# Patient Record
Sex: Female | Born: 1970 | Race: White | Hispanic: No | Marital: Married
Health system: Southern US, Community
[De-identification: ages and names within clinical notes are randomized; demographics above are authoritative.]

## PROBLEM LIST (undated history)

## (undated) DIAGNOSIS — Z9889 Other specified postprocedural states: Secondary | ICD-10-CM

## (undated) DIAGNOSIS — I10 Essential (primary) hypertension: Secondary | ICD-10-CM

## (undated) HISTORY — PX: WRIST SURGERY: SHX841

## (undated) HISTORY — PX: KNEE SURGERY: SHX244

## (undated) HISTORY — PX: BACK SURGERY: SHX140

---

## 2017-09-18 ENCOUNTER — Emergency Department (HOSPITAL_COMMUNITY): Payer: BLUE CROSS/BLUE SHIELD

## 2017-09-18 ENCOUNTER — Inpatient Hospital Stay (HOSPITAL_COMMUNITY)
Admission: EM | Admit: 2017-09-18 | Discharge: 2017-09-30 | DRG: 963 | Disposition: E | Payer: BLUE CROSS/BLUE SHIELD | Attending: General Surgery | Admitting: General Surgery

## 2017-09-18 ENCOUNTER — Encounter (HOSPITAL_COMMUNITY): Payer: Self-pay

## 2017-09-18 DIAGNOSIS — R402112 Coma scale, eyes open, never, at arrival to emergency department: Secondary | ICD-10-CM | POA: Diagnosis present

## 2017-09-18 DIAGNOSIS — G935 Compression of brain: Secondary | ICD-10-CM | POA: Diagnosis present

## 2017-09-18 DIAGNOSIS — R402212 Coma scale, best verbal response, none, at arrival to emergency department: Secondary | ICD-10-CM | POA: Diagnosis present

## 2017-09-18 DIAGNOSIS — S2241XA Multiple fractures of ribs, right side, initial encounter for closed fracture: Secondary | ICD-10-CM | POA: Diagnosis present

## 2017-09-18 DIAGNOSIS — G9382 Brain death: Secondary | ICD-10-CM | POA: Diagnosis present

## 2017-09-18 DIAGNOSIS — R402312 Coma scale, best motor response, none, at arrival to emergency department: Secondary | ICD-10-CM | POA: Diagnosis present

## 2017-09-18 DIAGNOSIS — J9601 Acute respiratory failure with hypoxia: Secondary | ICD-10-CM | POA: Diagnosis present

## 2017-09-18 DIAGNOSIS — S2220XA Unspecified fracture of sternum, initial encounter for closed fracture: Secondary | ICD-10-CM | POA: Diagnosis present

## 2017-09-18 DIAGNOSIS — S36039A Unspecified laceration of spleen, initial encounter: Secondary | ICD-10-CM | POA: Diagnosis present

## 2017-09-18 DIAGNOSIS — S20312A Abrasion of left front wall of thorax, initial encounter: Secondary | ICD-10-CM | POA: Diagnosis present

## 2017-09-18 DIAGNOSIS — R000 Tachycardia, unspecified: Secondary | ICD-10-CM | POA: Diagnosis present

## 2017-09-18 DIAGNOSIS — S13111A Dislocation of C0/C1 cervical vertebrae, initial encounter: Secondary | ICD-10-CM | POA: Diagnosis present

## 2017-09-18 DIAGNOSIS — S13121A Dislocation of C1/C2 cervical vertebrae, initial encounter: Secondary | ICD-10-CM | POA: Diagnosis present

## 2017-09-18 DIAGNOSIS — T07XXXA Unspecified multiple injuries, initial encounter: Secondary | ICD-10-CM

## 2017-09-18 DIAGNOSIS — S20311A Abrasion of right front wall of thorax, initial encounter: Secondary | ICD-10-CM | POA: Diagnosis present

## 2017-09-18 DIAGNOSIS — E232 Diabetes insipidus: Secondary | ICD-10-CM | POA: Diagnosis present

## 2017-09-18 HISTORY — DX: Essential (primary) hypertension: I10

## 2017-09-18 HISTORY — DX: Other specified postprocedural states: Z98.890

## 2017-09-18 LAB — CBC
HCT: 38.7 % (ref 36.0–46.0)
HCT: 37.8 % (ref 36.0–46.0)
Hemoglobin: 12.3 g/dL (ref 12.0–15.0)
Hemoglobin: 11.9 g/dL — ABNORMAL LOW (ref 12.0–15.0)
MCH: 29.7 pg (ref 26.0–34.0)
MCH: 29.5 pg (ref 26.0–34.0)
MCHC: 31.8 g/dL (ref 30.0–36.0)
MCHC: 31.5 g/dL (ref 30.0–36.0)
MCV: 93.5 fL (ref 78.0–100.0)
MCV: 93.6 fL (ref 78.0–100.0)
PLATELETS: 290 10*3/uL (ref 150–400)
PLATELETS: 261 10*3/uL (ref 150–400)
RBC: 4.14 MIL/uL (ref 3.87–5.11)
RBC: 4.04 MIL/uL (ref 3.87–5.11)
RDW: 13.7 % (ref 11.5–15.5)
RDW: 14 % (ref 11.5–15.5)
WBC: 13.2 10*3/uL — AB (ref 4.0–10.5)
WBC: 17.7 10*3/uL — ABNORMAL HIGH (ref 4.0–10.5)

## 2017-09-18 LAB — BASIC METABOLIC PANEL
Anion gap: 4 — ABNORMAL LOW (ref 5–15)
BUN: 16 mg/dL (ref 6–20)
CALCIUM: 7.6 mg/dL — AB (ref 8.9–10.3)
CO2: 22 mmol/L (ref 22–32)
CREATININE: 1.01 mg/dL — AB (ref 0.44–1.00)
Chloride: 117 mmol/L — ABNORMAL HIGH (ref 101–111)
Glucose, Bld: 142 mg/dL — ABNORMAL HIGH (ref 65–99)
Potassium: 5.1 mmol/L (ref 3.5–5.1)
SODIUM: 143 mmol/L (ref 135–145)

## 2017-09-18 LAB — ETHANOL

## 2017-09-18 LAB — BLOOD GAS, ARTERIAL
ACID-BASE DEFICIT: 5.5 mmol/L — AB (ref 0.0–2.0)
Acid-base deficit: 5.5 mmol/L — ABNORMAL HIGH (ref 0.0–2.0)
BICARBONATE: 21.9 mmol/L (ref 20.0–28.0)
Bicarbonate: 20.9 mmol/L (ref 20.0–28.0)
DRAWN BY: 24910
Drawn by: 36496
FIO2: 100
MECHVT: 520 mL
O2 CONTENT: 8 L/min
O2 SAT: 91.8 %
O2 SAT: 95.6 %
PATIENT TEMPERATURE: 96
PCO2 ART: 48.4 mmHg — AB (ref 32.0–48.0)
PEEP: 5 cmH2O
PH ART: 7.248 — AB (ref 7.350–7.450)
PO2 ART: 68.9 mmHg — AB (ref 83.0–108.0)
PO2 ART: 83.8 mmHg (ref 83.0–108.0)
Patient temperature: 96
RATE: 22 resp/min
pCO2 arterial: 58.7 mmHg — ABNORMAL HIGH (ref 32.0–48.0)
pH, Arterial: 7.186 — CL (ref 7.350–7.450)

## 2017-09-18 LAB — I-STAT CHEM 8, ED
BUN: 22 mg/dL — AB (ref 6–20)
CALCIUM ION: 1.08 mmol/L — AB (ref 1.15–1.40)
Chloride: 109 mmol/L (ref 101–111)
Creatinine, Ser: 0.9 mg/dL (ref 0.44–1.00)
Glucose, Bld: 157 mg/dL — ABNORMAL HIGH (ref 65–99)
HCT: 38 % (ref 36.0–46.0)
Hemoglobin: 12.9 g/dL (ref 12.0–15.0)
Potassium: 4.7 mmol/L (ref 3.5–5.1)
SODIUM: 140 mmol/L (ref 135–145)
TCO2: 22 mmol/L (ref 22–32)

## 2017-09-18 LAB — COMPREHENSIVE METABOLIC PANEL
ALT: 68 U/L — AB (ref 14–54)
AST: 103 U/L — ABNORMAL HIGH (ref 15–41)
Albumin: 3.3 g/dL — ABNORMAL LOW (ref 3.5–5.0)
Alkaline Phosphatase: 81 U/L (ref 38–126)
Anion gap: 6 (ref 5–15)
BUN: 18 mg/dL (ref 6–20)
CALCIUM: 8 mg/dL — AB (ref 8.9–10.3)
CHLORIDE: 110 mmol/L (ref 101–111)
CO2: 21 mmol/L — ABNORMAL LOW (ref 22–32)
CREATININE: 0.99 mg/dL (ref 0.44–1.00)
Glucose, Bld: 158 mg/dL — ABNORMAL HIGH (ref 65–99)
Potassium: 4.8 mmol/L (ref 3.5–5.1)
Sodium: 137 mmol/L (ref 135–145)
Total Bilirubin: 0.3 mg/dL (ref 0.3–1.2)
Total Protein: 6.3 g/dL — ABNORMAL LOW (ref 6.5–8.1)

## 2017-09-18 LAB — URINALYSIS, ROUTINE W REFLEX MICROSCOPIC
BILIRUBIN URINE: NEGATIVE
GLUCOSE, UA: 50 mg/dL — AB
KETONES UR: NEGATIVE mg/dL
LEUKOCYTES UA: NEGATIVE
NITRITE: NEGATIVE
PH: 8 (ref 5.0–8.0)
Protein, ur: 100 mg/dL — AB
Specific Gravity, Urine: 1.009 (ref 1.005–1.030)
Squamous Epithelial / HPF: NONE SEEN

## 2017-09-18 LAB — LACTIC ACID, PLASMA: LACTIC ACID, VENOUS: 1.7 mmol/L (ref 0.5–1.9)

## 2017-09-18 LAB — I-STAT CG4 LACTIC ACID, ED: Lactic Acid, Venous: 2.16 mmol/L (ref 0.5–1.9)

## 2017-09-18 LAB — PROTIME-INR
INR: 1.16
INR: 1.08
PROTHROMBIN TIME: 14.7 s (ref 11.4–15.2)
PROTHROMBIN TIME: 13.9 s (ref 11.4–15.2)

## 2017-09-18 LAB — ABO/RH: ABO/RH(D): O POS

## 2017-09-18 MED ORDER — SODIUM CHLORIDE 0.9 % IV SOLN
0.0000 ug/min | INTRAVENOUS | Status: DC
  Administered 2017-09-18: 200 ug/min via INTRAVENOUS
  Administered 2017-09-18: 20 ug/min via INTRAVENOUS
  Administered 2017-09-18 (×2): 180 ug/min via INTRAVENOUS
  Filled 2017-09-18 (×5): qty 1

## 2017-09-18 MED ORDER — SODIUM CHLORIDE 0.9 % IV SOLN
INTRAVENOUS | Status: DC
  Administered 2017-09-18 – 2017-09-19 (×3): via INTRAVENOUS

## 2017-09-18 MED ORDER — PANTOPRAZOLE SODIUM 40 MG PO TBEC: 40.0000 mg | DELAYED_RELEASE_TABLET | Freq: Every day | ORAL | Status: DC

## 2017-09-18 MED ORDER — IOPAMIDOL (ISOVUE-300) INJECTION 61%
INTRAVENOUS | Status: AC
  Filled 2017-09-18: qty 100

## 2017-09-18 MED ORDER — SODIUM CHLORIDE 0.9% FLUSH: 10.0000 mL | INTRAVENOUS | Status: DC | PRN

## 2017-09-18 MED ORDER — PANTOPRAZOLE SODIUM 40 MG IV SOLR
40.0000 mg | Freq: Every day | INTRAVENOUS | Status: DC
  Administered 2017-09-18 – 2017-09-19 (×2): 40 mg via INTRAVENOUS
  Filled 2017-09-18 (×2): qty 40

## 2017-09-18 MED ORDER — SODIUM CHLORIDE 0.9 % IV SOLN
0.0000 ug/min | INTRAVENOUS | Status: DC
  Administered 2017-09-18: 225 ug/min via INTRAVENOUS
  Filled 2017-09-18: qty 4

## 2017-09-18 MED ORDER — PHENYLEPHRINE 40 MCG/ML (10ML) SYRINGE FOR IV PUSH (FOR BLOOD PRESSURE SUPPORT)
PREFILLED_SYRINGE | INTRAVENOUS | Status: AC
  Filled 2017-09-18: qty 10

## 2017-09-18 MED ORDER — SODIUM CHLORIDE 0.9 % IV SOLN
INTRAVENOUS | Status: AC | PRN
  Administered 2017-09-18 (×2): 999 mL/h via INTRAVENOUS

## 2017-09-18 MED ORDER — SODIUM CHLORIDE 0.9 % IV SOLN
0.0000 ug/min | INTRAVENOUS | Status: DC
  Administered 2017-09-19 (×2): 225 ug/min via INTRAVENOUS
  Filled 2017-09-18 (×3): qty 8

## 2017-09-18 MED ORDER — PHENYLEPHRINE HCL 10 MG/ML IJ SOLN
INTRAMUSCULAR | Status: AC | PRN
  Administered 2017-09-18: 80 ug

## 2017-09-18 MED ORDER — SODIUM CHLORIDE 0.9% FLUSH
10.0000 mL | Freq: Two times a day (BID) | INTRAVENOUS | Status: DC
  Administered 2017-09-18: 10 mL

## 2017-09-18 MED ORDER — MIDAZOLAM HCL 2 MG/2ML IJ SOLN
INTRAMUSCULAR | Status: AC
  Filled 2017-09-18: qty 6

## 2017-09-18 MED ORDER — FENTANYL CITRATE (PF) 100 MCG/2ML IJ SOLN
INTRAMUSCULAR | Status: AC
  Filled 2017-09-18: qty 2

## 2017-09-18 MED ORDER — CHLORHEXIDINE GLUCONATE CLOTH 2 % EX PADS
6.0000 | MEDICATED_PAD | Freq: Every day | CUTANEOUS | Status: DC
  Administered 2017-09-18 – 2017-09-19 (×2): 6 via TOPICAL
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
## 2017-09-18 NOTE — ED Notes (Signed)
Pt transported to CT 1 
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
## 2017-09-18 NOTE — Progress Notes (Signed)
2017-09-27- Respiratory care note- Apnea test performed with MD at bedside and RN.  Pt placed on 8lpm oxygen as per protocol with ABG drawn prior to initiation of test.  Test initiated as per protocol.  2044 Initial vital signs HR 96, SATS 97%, BP 124/99. 2047 vital signs HR 93, SATS 99%, BP 115/93. 2049 vital signs HR 93, SATS 98%, BP 98/76.  2051 vital signs HR 93, SATS 97%, BP 85/50.  Test completed at 2052 and ABG drawn.  Pt placed back on vent at time of end of test.

## 2017-09-18 NOTE — ED Notes (Signed)
Family at bedside. 

## 2017-09-18 NOTE — Progress Notes (Signed)
CDS called and did not feel there was enough documentation to approach family Apnea test done CO 2 increase only 10 to total of 58 which is not consistent with a positive test  Husband wants 100  % assurance of brain death and this test does not support Clinical exam does support brain death   Jun 25, 2023 need perfusion study in am to give husband reassurance   Continue present care for now

## 2017-09-18 NOTE — Progress Notes (Signed)
RT NOTE:  Apnea test complete. Pre and Post ABG results reported to Dr. Luisa Hart.
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
## 2017-09-18 NOTE — ED Notes (Signed)
MD Wyatt at the bedside.  

## 2017-09-18 NOTE — ED Notes (Signed)
Family at the bedside.

## 2017-09-18 NOTE — ED Notes (Signed)
[facility] is aware of patient's presence.
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
## 2017-09-18 NOTE — ED Notes (Signed)
Phlebotomy at the bedside  

## 2017-09-18 NOTE — Progress Notes (Signed)
Orthopedic Tech Progress Note Patient Details:  Leah [name] 06/05/1971 [id number]  Patient ID: [name], female   DOB: 02-20-1971, 46 y.o.   MRN: [id number]   Nikki Dom 09/11/2017, 3:48 PM Made level 1 trauma visit

## 2017-09-19 ENCOUNTER — Encounter (HOSPITAL_COMMUNITY): Payer: Self-pay

## 2017-09-19 ENCOUNTER — Inpatient Hospital Stay (HOSPITAL_COMMUNITY): Payer: BLUE CROSS/BLUE SHIELD

## 2017-09-19 LAB — MASSIVE TRANSFUSION PROTOCOL ORDER (BLOOD BANK NOTIFICATION)

## 2017-09-19 LAB — PREPARE FRESH FROZEN PLASMA
UNIT DIVISION: 0
UNIT DIVISION: 0
UNIT DIVISION: 0
UNIT DIVISION: 0
UNIT DIVISION: 0
Unit division: 0

## 2017-09-19 LAB — BPAM FFP
BLOOD PRODUCT EXPIRATION DATE: 201809242359
Blood Product Expiration Date: 201809192359
Blood Product Expiration Date: 201809222359
Blood Product Expiration Date: 201809232359
Blood Product Expiration Date: 201809242359
Blood Product Expiration Date: 201810032359
ISSUE DATE / TIME: 201809191524
ISSUE DATE / TIME: 201809191545
ISSUE DATE / TIME: 201809191545
ISSUE DATE / TIME: 201809191524
ISSUE DATE / TIME: 201809191545
ISSUE DATE / TIME: 201809191545
UNIT TYPE AND RH: 6200
UNIT TYPE AND RH: 6200
Unit Type and Rh: 600
Unit Type and Rh: 6200
Unit Type and Rh: 6200
Unit Type and Rh: 8400

## 2017-09-19 LAB — BLOOD GAS, ARTERIAL
Acid-base deficit: 2.1 mmol/L — ABNORMAL HIGH (ref 0.0–2.0)
Bicarbonate: 24.6 mmol/L (ref 20.0–28.0)
DRAWN BY: 33100
FIO2: 100
MECHVT: 520 mL
O2 Saturation: 94.5 %
PEEP: 5 cmH2O
Patient temperature: 98.6
RATE: 22 resp/min
pCO2 arterial: 61.5 mmHg — ABNORMAL HIGH (ref 32.0–48.0)
pH, Arterial: 7.226 — ABNORMAL LOW (ref 7.350–7.450)
pO2, Arterial: 79 mmHg — ABNORMAL LOW (ref 83.0–108.0)

## 2017-09-19 LAB — BASIC METABOLIC PANEL
Anion gap: 7 (ref 5–15)
BUN: 13 mg/dL (ref 6–20)
CO2: 22 mmol/L (ref 22–32)
Calcium: 8 mg/dL — ABNORMAL LOW (ref 8.9–10.3)
Chloride: 123 mmol/L — ABNORMAL HIGH (ref 101–111)
Creatinine, Ser: 0.84 mg/dL (ref 0.44–1.00)
GFR calc Af Amer: >60 mL/min (ref >60)
GFR calc non Af Amer: >60 mL/min (ref >60)
Glucose, Bld: 151 mg/dL — ABNORMAL HIGH (ref 65–99)
POTASSIUM: 4.6 mmol/L (ref 3.5–5.1)
SODIUM: 152 mmol/L — AB (ref 135–145)

## 2017-09-19 LAB — CBC
HEMATOCRIT: 44.5 % (ref 36.0–46.0)
Hemoglobin: 14.3 g/dL (ref 12.0–15.0)
MCH: 30 pg (ref 26.0–34.0)
MCHC: 32.1 g/dL (ref 30.0–36.0)
MCV: 93.5 fL (ref 78.0–100.0)
Platelets: 297 10*3/uL (ref 150–400)
RBC: 4.76 MIL/uL (ref 3.87–5.11)
RDW: 14.4 % (ref 11.5–15.5)
WBC: 16.5 10*3/uL — ABNORMAL HIGH (ref 4.0–10.5)

## 2017-09-19 LAB — MRSA PCR SCREENING: MRSA BY PCR: POSITIVE — AB

## 2017-09-19 LAB — CDS SEROLOGY

## 2017-09-19 LAB — BLOOD PRODUCT ORDER (VERBAL) VERIFICATION

## 2017-09-19 MED ORDER — MORPHINE BOLUS VIA INFUSION
5.0000 mg | INTRAVENOUS | Status: DC | PRN
  Filled 2017-09-19: qty 20

## 2017-09-19 MED ORDER — SODIUM CHLORIDE 0.9 % IV SOLN
20.0000 ug/h | INTRAVENOUS | Status: DC
  Administered 2017-09-19: 20 ug/h via INTRAVENOUS
  Filled 2017-09-19: qty 10

## 2017-09-19 MED ORDER — CHLORHEXIDINE GLUCONATE 0.12% ORAL RINSE (MEDLINE KIT)
15.0000 mL | Freq: Two times a day (BID) | OROMUCOSAL | Status: DC
  Administered 2017-09-19 (×2): 15 mL via OROMUCOSAL

## 2017-09-19 MED ORDER — SODIUM CHLORIDE 0.9 % IV SOLN
20.0000 ug | Freq: Once | INTRAVENOUS | Status: AC
  Administered 2017-09-19: 20 ug via INTRAVENOUS
  Filled 2017-09-19: qty 5

## 2017-09-19 MED ORDER — SODIUM CHLORIDE 0.9 % IV BOLUS (SEPSIS)
1000.0000 mL | Freq: Once | INTRAVENOUS | Status: AC
  Administered 2017-09-19: 1000 mL via INTRAVENOUS

## 2017-09-19 MED ORDER — LEVOTHYROXINE SODIUM 100 MCG IV SOLR: 25.0000 ug | Freq: Every day | INTRAVENOUS | Status: DC

## 2017-09-19 MED ORDER — NOREPINEPHRINE BITARTRATE 1 MG/ML IV SOLN
0.0000 ug/min | INTRAVENOUS | Status: DC
  Filled 2017-09-19: qty 4

## 2017-09-19 MED ORDER — TECHNETIUM TC 99M EXAMETAZIME IV KIT
20.0000 | PACK | Freq: Once | INTRAVENOUS | Status: DC | PRN
Start: 2017-09-19 — End: 2017-09-19

## 2017-09-19 MED ORDER — ORAL CARE MOUTH RINSE
15.0000 mL | OROMUCOSAL | Status: DC
  Administered 2017-09-19 (×4): 15 mL via OROMUCOSAL

## 2017-09-19 MED ORDER — IPRATROPIUM-ALBUTEROL 0.5-2.5 (3) MG/3ML IN SOLN
3.0000 mL | Freq: Four times a day (QID) | RESPIRATORY_TRACT | Status: DC
  Administered 2017-09-19: 3 mL via RESPIRATORY_TRACT
  Filled 2017-09-19: qty 3

## 2017-09-19 MED ORDER — LEVOTHYROXINE SODIUM 100 MCG IV SOLR
25.0000 ug | Freq: Once | INTRAVENOUS | Status: AC
  Administered 2017-09-19: 25 ug via INTRAVENOUS
  Filled 2017-09-19: qty 5

## 2017-09-19 MED ORDER — SODIUM CHLORIDE 0.9 % IV SOLN
10.0000 mg/h | INTRAVENOUS | Status: DC
  Filled 2017-09-19: qty 10

## 2017-09-19 MED ORDER — MUPIROCIN 2 % EX OINT
1.0000 "application " | TOPICAL_OINTMENT | Freq: Two times a day (BID) | CUTANEOUS | Status: DC
  Administered 2017-09-19: 1 via NASAL
  Filled 2017-09-19: qty 22

## 2017-09-20 LAB — TYPE AND SCREEN
ABO/RH(D): O POS
Antibody Screen: NEGATIVE
UNIT DIVISION: 0
UNIT DIVISION: 0
UNIT DIVISION: 0
UNIT DIVISION: 0
UNIT DIVISION: 0
Unit division: 0
Unit division: 0
Unit division: 0
Unit division: 0
Unit division: 0
Unit division: 0
Unit division: 0
Unit division: 0
Unit division: 0

## 2017-09-20 LAB — BPAM RBC
BLOOD PRODUCT EXPIRATION DATE: 201810082359
BLOOD PRODUCT EXPIRATION DATE: 201810102359
BLOOD PRODUCT EXPIRATION DATE: 201810102359
BLOOD PRODUCT EXPIRATION DATE: 201810122359
BLOOD PRODUCT EXPIRATION DATE: 201810162359
BLOOD PRODUCT EXPIRATION DATE: 201810172359
Blood Product Expiration Date: 201810082359
Blood Product Expiration Date: 201810082359
Blood Product Expiration Date: 201810092359
Blood Product Expiration Date: 201810102359
Blood Product Expiration Date: 201810112359
Blood Product Expiration Date: 201810122359
Blood Product Expiration Date: 201810172359
Blood Product Expiration Date: 201810172359
ISSUE DATE / TIME: 201809191524
ISSUE DATE / TIME: 201809191524
ISSUE DATE / TIME: 201809191550
ISSUE DATE / TIME: 201809201613
ISSUE DATE / TIME: 201809201805
ISSUE DATE / TIME: 201809201805
ISSUE DATE / TIME: 201809202349
ISSUE DATE / TIME: 201809191550
ISSUE DATE / TIME: 201809191635
ISSUE DATE / TIME: 201809191635
ISSUE DATE / TIME: 201809201613
ISSUE DATE / TIME: 201809202349
UNIT TYPE AND RH: 5100
UNIT TYPE AND RH: 5100
UNIT TYPE AND RH: 9500
UNIT TYPE AND RH: 9500
UNIT TYPE AND RH: 9500
UNIT TYPE AND RH: 9500
UNIT TYPE AND RH: 9500
Unit Type and Rh: 5100
Unit Type and Rh: 5100
Unit Type and Rh: 9500
Unit Type and Rh: 9500
Unit Type and Rh: 9500
Unit Type and Rh: 9500
Unit Type and Rh: 9500

## 2017-09-30 NOTE — Progress Notes (Signed)
Follow up - Trauma and Critical Care  Patient Details:    Leah [name].  Lines/tubes : Airway 6 mm (Active)  Secured at (cm) 23 cm 09/20/2017  7:48 AM  Measured From Lips 09/05/2017  7:48 AM  Secured Location Right 09/02/2017  7:48 AM  Secured By [employer] 09/16/2017  7:48 AM  Tube Holder Repositioned Yes 08/31/2017  7:48 AM  Cuff Pressure (cm H2O) 26 cm H2O 09/11/2017  7:48 AM  Site Condition Dry 09/17/2017  7:48 AM     CVC Single Lumen 10/13/2017 Right Femoral (Active)  Indication for Insertion or Continuance of Line Vasoactive infusions 09/12/2017  8:00 AM  Site Assessment Clean;Dry;Intact 09/01/2017  8:00 AM  Line Status Flushed 09/02/2017  8:00 AM  Dressing Type Transparent 09/20/2017  8:00 AM  Dressing Status Clean;Dry;Intact;Antimicrobial disc in place 09/15/2017  8:00 AM  Line Care Tubing changed;Other (Comment) 10/13/17 10:00 PM  Dressing Intervention New dressing 10-13-17  4:16 PM  Dressing Change Due 09/25/17 08/31/2017  8:00 AM     Urethral Catheter Apolonio Schneiders RN Temperature probe 16 Fr. (Active)  Indication for Insertion or Continuance of Catheter Unstable critical patients (first 24-48 hours) 09/07/2017  8:00 AM  Site Assessment Clean;Intact 09/27/2017  8:00 AM  Catheter Maintenance Bag below level of bladder;Catheter secured;Drainage bag/tubing not touching floor;Insertion date on drainage bag;Seal intact;No dependent loops 09/16/2017  8:00 AM  Collection Container Standard drainage bag 09/22/2017  8:00 AM  Securement Method Securing device (Describe) 09/13/2017  8:00 AM  Urinary Catheter Interventions Unclamped 09/14/2017  8:00 AM  Output (mL) 225 mL 09/05/2017  8:00 AM    Microbiology/Sepsis markers: No results found for this or any previous visit.  Anti-infectives:  Anti-infectives    None      Best Practice/Protocols:  VTE Prophylaxis: Mechanical GI Prophylaxis: Proton Pump Inhibitor No sedation but requiring Neosynephrine to  maintain adequate BP  Consults: Treatment Team:  Md, Trauma, MD    Events:  Subjective:    Overnight Issues: Patient not declared brain dead by others because she did not have an adequate apnea test.  Will get formal brain flow study today.    Objective:  Vital signs for last 24 hours: Temp:  [95.5 F (35.3 C)-101.3 F (38.5 C)] 100 F (37.8 C) (09/20 0800) Pulse Rate:  [84-147] 108 (09/20 0800) Resp:  [0-22] 22 (09/20 0800) BP: (57-167)/(38-118) 112/82 (09/20 0800) SpO2:  [88 %-100 %] 99 % (09/20 0800) FiO2 (%):  [100 %] 100 % (09/20 0800) Weight:  [99.8 kg (220 lb 0.3 oz)-113.4 kg (250 lb)] 99.8 kg (220 lb 0.3 oz) (09/20 0000)  Hemodynamic parameters for last 24 hours:    Intake/Output from previous day: 09/19 0701 - 09/20 0700 In: 7010.5 [I.V.:5960.5; IV Piggyback:1050] Out: 8450 [Urine:8450]  Intake/Output this shift: Total I/O In: 234.4 [I.V.:234.4] Out: 225 [Urine:225]  Vent settings for last 24 hours: Vent Mode: PRVC FiO2 (%):  [100 %] 100 % Set Rate:  [18 bmp-22 bmp] 22 bmp Vt Set:  [490 mL-520 mL] 520 mL PEEP:  [5 cmH20] 5 cmH20 Plateau Pressure:  [25 cmH20-27 cmH20] 27 cmH20  Physical Exam:  General: no respiratory distress and not overbreathing the ventilator Neuro: RASS -3 or deeper and Not moving anything or reacting at all. Resp: wheezes RLL and RML CVS: Sinus tachycardia GI: Soft and not distended.  Hypoactive bowel sounds. Extremities: no edema, no erythema, pulses WNL  Results for orders placed or performed during the hospital encounter  of 25-Sep-2017 (from the past 24 hour(s))  Prepare fresh frozen plasma     Status: None   Collection Time: 09-25-17  3:19 PM  Result Value Ref Range   Unit Number [id number]    Blood Component Type LIQ PLASMA    Unit division 00    Status of Unit ISSUED,FINAL    Unit tag comment VERBAL ORDERS PER DR PFEIFFER    Transfusion Status OK TO TRANSFUSE    Unit Number [id number]    Blood Component Type  THWPLS APHR2    Unit division 00    Status of Unit ISSUED,FINAL    Unit tag comment VERBAL ORDERS PER DR PFEIFFER    Transfusion Status OK TO TRANSFUSE    Unit Number [id number]    Blood Component Type THWPLS APHR1    Unit division 00    Status of Unit REL FROM [facility]    Unit tag comment VERBAL ORDERS PER DR PFEIFFER    Transfusion Status OK TO TRANSFUSE    Unit Number [id number]    Blood Component Type THAWED PLASMA    Unit division 00    Status of Unit REL FROM [facility]    Unit tag comment VERBAL ORDERS PER DR PFEIFFER    Transfusion Status OK TO TRANSFUSE    Unit Number [id number]    Blood Component Type THAWED PLASMA    Unit division 00    Status of Unit REL FROM [facility]    Unit tag comment VERBAL ORDERS PER DR PFEIFFER    Transfusion Status OK TO TRANSFUSE    Unit Number [id number]    Blood Component Type LIQ PLASMA    Unit division 00    Status of Unit REL FROM [facility]    Unit tag comment VERBAL ORDERS PER DR PFEIFFER    Transfusion Status OK TO TRANSFUSE   Type and screen     Status: None (Preliminary result)   Collection Time: September 25, 2017  3:48 PM  Result Value Ref Range   ABO/RH(D) O POS    Antibody Screen NEG    Sample Expiration 09/21/2017    Unit Number [id number]    Blood Component Type RED CELLS,LR    Unit division 00    Status of Unit ISSUED,FINAL    Unit tag comment VERBAL ORDERS PER DR PFEIFFER    Transfusion Status OK TO TRANSFUSE    Crossmatch Result COMPATIBLE    Unit Number [id number]    Blood Component Type RED CELLS,LR    Unit division 00    Status of Unit ISSUED,FINAL    Unit tag comment VERBAL ORDERS PER DR PFEIFFER    Transfusion Status OK TO TRANSFUSE    Crossmatch Result COMPATIBLE    Unit Number [id number]    Blood Component Type RED CELLS,LR    Unit division 00    Status of Unit REL FROM [facility]    Unit tag comment VERBAL ORDERS PER DR PFEIFFER    Transfusion Status OK TO TRANSFUSE    Crossmatch Result NOT NEEDED    Unit  Number [id number]    Blood Component Type RED CELLS,LR    Unit division 00    Status of Unit REL FROM [facility]    Unit tag comment VERBAL ORDERS PER DR PFEIFFER    Transfusion Status OK TO TRANSFUSE    Crossmatch Result NOT NEEDED    Unit Number [id number]    Blood Component Type RED CELLS,LR    Unit division 00    Status of Unit REL  FROM [facility]    Unit tag comment VERBAL ORDERS PER DR PFEIFFER    Transfusion Status OK TO TRANSFUSE    Crossmatch Result NOT NEEDED    Unit Number [id number]    Blood Component Type RED CELLS,LR    Unit division 00    Status of Unit REL FROM [facility]    Unit tag comment VERBAL ORDERS PER DR PFEIFFER    Transfusion Status OK TO TRANSFUSE    Crossmatch Result NOT NEEDED    Unit Number [id number]    Blood Component Type RED CELLS,LR    Unit division 00    Status of Unit REL FROM [facility]    Unit tag comment VERBAL ORDERS PER DR Chemeka Filice    Transfusion Status OK TO TRANSFUSE    Crossmatch Result NOT NEEDED    Unit Number [id number]    Blood Component Type RBC LR PHER1    Unit division 00    Status of Unit REL FROM [facility]    Unit tag comment VERBAL ORDERS PER DR Prentiss Polio    Transfusion Status OK TO TRANSFUSE    Crossmatch Result NOT NEEDED    Unit Number [id number]    Blood Component Type RBC LR PHER1    Unit division 00    Status of Unit REL FROM [facility]    Unit tag comment VERBAL ORDERS PER DR Jule Whitsel    Transfusion Status OK TO TRANSFUSE    Crossmatch Result NOT NEEDED    Unit Number [id number]    Blood Component Type RBC LR PHER1    Unit division 00    Status of Unit REL FROM [facility]    Unit tag comment VERBAL ORDERS PER DR Madilyn Cephas    Transfusion Status OK TO TRANSFUSE    Crossmatch Result NOT NEEDED    Unit Number [id number]    Blood Component Type RED CELLS,LR    Unit division 00    Status of Unit REL FROM [facility]    Transfusion Status OK TO TRANSFUSE    Crossmatch Result Compatible    Unit Number [id number]    Blood Component  Type RBC LR PHER2    Unit division 00    Status of Unit ALLOCATED    Transfusion Status OK TO TRANSFUSE    Crossmatch Result Compatible    Unit Number [id number]    Blood Component Type RBC LR PHER1    Unit division 00    Status of Unit REL FROM [facility]    Transfusion Status OK TO TRANSFUSE    Crossmatch Result Compatible    Unit Number [id number]    Blood Component Type RED CELLS,LR    Unit division 00    Status of Unit ALLOCATED    Transfusion Status OK TO TRANSFUSE    Crossmatch Result Compatible   ABO/Rh     Status: None   Collection Time: 08/31/2017  3:48 PM  Result Value Ref Range   ABO/RH(D) O POS   I-stat chem 8, ed     Status: Abnormal   Collection Time: 09/24/2017  3:53 PM  Result Value Ref Range   Sodium 140 135 - 145 mmol/L   Potassium 4.7 3.5 - 5.1 mmol/L   Chloride 109 101 - 111 mmol/L   BUN 22 (H) 6 - 20 mg/dL   Creatinine, Ser 7.62 0.44 - 1.00 mg/dL   Glucose, Bld 831 (H) 65 - 99 mg/dL   Calcium, Ion 5.17 (L) 1.15 - 1.40 mmol/L   TCO2 22 22 - 32 mmol/L  Hemoglobin 12.9 12.0 - 15.0 g/dL   HCT 16.1 09.6 - 04.5 %  I-Stat CG4 Lactic Acid, ED     Status: Abnormal   Collection Time: 10/07/17  3:53 PM  Result Value Ref Range   Lactic Acid, Venous 2.16 (HH) 0.5 - 1.9 mmol/L   Comment NOTIFIED PHYSICIAN   Ethanol     Status: None   Collection Time: 10/07/2017  4:00 PM  Result Value Ref Range   Alcohol, Ethyl (B) <5 <5 mg/dL  Urinalysis, Routine w reflex microscopic     Status: Abnormal   Collection Time: October 07, 2017  4:34 PM  Result Value Ref Range   Color, Urine STRAW (A) YELLOW   APPearance CLEAR CLEAR   Specific Gravity, Urine 1.009 1.005 - 1.030   pH 8.0 5.0 - 8.0   Glucose, UA 50 (A) NEGATIVE mg/dL   Hgb urine dipstick MODERATE (A) NEGATIVE   Bilirubin Urine NEGATIVE NEGATIVE   Ketones, ur NEGATIVE NEGATIVE mg/dL   Protein, ur 409 (A) NEGATIVE mg/dL   Nitrite NEGATIVE NEGATIVE   Leukocytes, UA NEGATIVE NEGATIVE   RBC / HPF 0-5 0 - 5 RBC/hpf   WBC, UA  0-5 0 - 5 WBC/hpf   Bacteria, UA RARE (A) NONE SEEN   Squamous Epithelial / LPF NONE SEEN NONE SEEN  CDS serology     Status: None   Collection Time: 2017/10/07  5:59 PM  Result Value Ref Range   CDS serology specimen      SPECIMEN WILL BE HELD FOR 14 DAYS IF TESTING IS REQUIRED  Comprehensive metabolic panel     Status: Abnormal   Collection Time: October 07, 2017  5:59 PM  Result Value Ref Range   Sodium 137 135 - 145 mmol/L   Potassium 4.8 3.5 - 5.1 mmol/L   Chloride 110 101 - 111 mmol/L   CO2 21 (L) 22 - 32 mmol/L   Glucose, Bld 158 (H) 65 - 99 mg/dL   BUN 18 6 - 20 mg/dL   Creatinine, Ser 8.11 0.44 - 1.00 mg/dL   Calcium 8.0 (L) 8.9 - 10.3 mg/dL   Total Protein 6.3 (L) 6.5 - 8.1 g/dL   Albumin 3.3 (L) 3.5 - 5.0 g/dL   AST 914 (H) 15 - 41 U/L   ALT 68 (H) 14 - 54 U/L   Alkaline Phosphatase 81 38 - 126 U/L   Total Bilirubin 0.3 0.3 - 1.2 mg/dL   GFR calc non Af Amer >60 >60 mL/min   GFR calc Af Amer >60 >60 mL/min   Anion gap 6 5 - 15  CBC     Status: Abnormal   Collection Time: 2017/10/07  5:59 PM  Result Value Ref Range   WBC 13.2 (H) 4.0 - 10.5 K/uL   RBC 4.14 3.87 - 5.11 MIL/uL   Hemoglobin 12.3 12.0 - 15.0 g/dL   HCT 78.2 95.6 - 21.3 %   MCV 93.5 78.0 - 100.0 fL   MCH 29.7 26.0 - 34.0 pg   MCHC 31.8 30.0 - 36.0 g/dL   RDW 08.6 57.8 - 46.9 %   Platelets 290 150 - 400 K/uL  Protime-INR     Status: None   Collection Time: Oct 07, 2017  5:59 PM  Result Value Ref Range   Prothrombin Time 14.7 11.4 - 15.2 seconds   INR 1.16   Blood gas, arterial     Status: Abnormal   Collection Time: 10/07/17  8:30 PM  Result Value Ref Range   FIO2 100.00  Delivery systems VENTILATOR    Mode PRESSURE REGULATED VOLUME CONTROL    VT 520 mL   LHR 22 resp/min   Peep/cpap 5.0 cm H20   pH, Arterial 7.248 (L) 7.350 - 7.450   pCO2 arterial 48.4 (H) 32.0 - 48.0 mmHg   pO2, Arterial 83.8 83.0 - 108.0 mmHg   Bicarbonate 20.9 20.0 - 28.0 mmol/L   Acid-base deficit 5.5 (H) 0.0 - 2.0 mmol/L   O2  Saturation 95.6 %   Patient temperature 96.0    Collection site RIGHT RADIAL    Drawn by [phone]    Sample type ARTERIAL    Allens test (pass/fail) PASS PASS  Blood gas, arterial     Status: Abnormal   Collection Time: 09/10/2017  9:00 PM  Result Value Ref Range   O2 Content 8.0 L/min   Delivery systems VIA ETT FOR APNEA TEST    pH, Arterial 7.186 (LL) 7.350 - 7.450   pCO2 arterial 58.7 (H) 32.0 - 48.0 mmHg   pO2, Arterial 68.9 (L) 83.0 - 108.0 mmHg   Bicarbonate 21.9 20.0 - 28.0 mmol/L   Acid-base deficit 5.5 (H) 0.0 - 2.0 mmol/L   O2 Saturation 91.8 %   Patient temperature 96.0    Collection site RIGHT BRACHIAL    Drawn by [phone]    Sample type ARTERIAL    Allens test (pass/fail) PASS PASS  Lactic acid, plasma     Status: None   Collection Time: 09/03/2017  9:12 PM  Result Value Ref Range   Lactic Acid, Venous 1.7 0.5 - 1.9 mmol/L  CBC     Status: Abnormal   Collection Time: 09/23/2017  9:12 PM  Result Value Ref Range   WBC 17.7 (H) 4.0 - 10.5 K/uL   RBC 4.04 3.87 - 5.11 MIL/uL   Hemoglobin 11.9 (L) 12.0 - 15.0 g/dL   HCT 09.8 11.9 - 14.7 %   MCV 93.6 78.0 - 100.0 fL   MCH 29.5 26.0 - 34.0 pg   MCHC 31.5 30.0 - 36.0 g/dL   RDW 82.9 56.2 - 13.0 %   Platelets 261 150 - 400 K/uL  Basic metabolic panel     Status: Abnormal   Collection Time: 09/24/2017  9:12 PM  Result Value Ref Range   Sodium 143 135 - 145 mmol/L   Potassium 5.1 3.5 - 5.1 mmol/L   Chloride 117 (H) 101 - 111 mmol/L   CO2 22 22 - 32 mmol/L   Glucose, Bld 142 (H) 65 - 99 mg/dL   BUN 16 6 - 20 mg/dL   Creatinine, Ser 8.65 (H) 0.44 - 1.00 mg/dL   Calcium 7.6 (L) 8.9 - 10.3 mg/dL   GFR calc non Af Amer >60 >60 mL/min   GFR calc Af Amer >60 >60 mL/min   Anion gap 4 (L) 5 - 15  Protime-INR     Status: None   Collection Time: 09/01/2017  9:12 PM  Result Value Ref Range   Prothrombin Time 13.9 11.4 - 15.2 seconds   INR 1.08   CBC     Status: Abnormal   Collection Time: 24-Sep-2017  2:07 AM  Result Value Ref Range    WBC 16.5 (H) 4.0 - 10.5 K/uL   RBC 4.76 3.87 - 5.11 MIL/uL   Hemoglobin 14.3 12.0 - 15.0 g/dL   HCT 78.4 69.6 - 29.5 %   MCV 93.5 78.0 - 100.0 fL   MCH 30.0 26.0 - 34.0 pg   MCHC 32.1 30.0 - 36.0 g/dL  RDW 14.4 11.5 - 15.5 %   Platelets 297 150 - 400 K/uL  Basic metabolic panel     Status: Abnormal   Collection Time: October 08, 2017  2:07 AM  Result Value Ref Range   Sodium 152 (H) 135 - 145 mmol/L   Potassium 4.6 3.5 - 5.1 mmol/L   Chloride 123 (H) 101 - 111 mmol/L   CO2 22 22 - 32 mmol/L   Glucose, Bld 151 (H) 65 - 99 mg/dL   BUN 13 6 - 20 mg/dL   Creatinine, Ser 1.61 0.44 - 1.00 mg/dL   Calcium 8.0 (L) 8.9 - 10.3 mg/dL   GFR calc non Af Amer >60 >60 mL/min   GFR calc Af Amer >60 >60 mL/min   Anion gap 7 5 - 15     Assessment/Plan:   NEURO  Altered Mental Status:  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  Will get more information later.  Spoke with husband at bedside    LOS: 1 day   Additional comments:I reviewed the patient's new clinical lab test results. cbc/bmet, I reviewed the patients new imaging test results. cxr and I have discussed and reviewed with family members patient's husband  Critical Care Total Time*: 30 Minutes  Dacari Beckstrand 2017/10/08  *Care during the described time interval was provided by me and/or other providers on the critical care team.  I have reviewed this patient's available data, including medical history, events of note, physical examination and test results as part  of my evaluation.

## 2017-09-30 NOTE — Progress Notes (Signed)
At 0400, pt had another of urine output within 2 hours. Reassessed urine specific gravity - resulted 1.001 and notified MD a second time for further interventions.  MD placed order for DDAVP.  Will continue to monitor closely.  Francia Greaves, RN

## 2017-09-30 NOTE — Progress Notes (Signed)
At 0200, pt had of urine output within 3 hours. Notified on-call trauma MD after assessing urine specific gravity - resulted 1.002. RN received order from MD to administer a 0.9% NS bolus to pt.  Will continue to monitor closely.  Francia Greaves, RN

## 2017-09-30 NOTE — Procedures (Signed)
Pt transported to IR on vent, no complications. 

## 2017-09-30 NOTE — Progress Notes (Signed)
Waiting on additional family members to arrive before a family meeting at 1300. Husband is at the bedside and making calls to family. He is unaware of NM flow study results at this time. Khale Nigh, [place] Scrape, RN

## 2017-09-30 NOTE — Progress Notes (Signed)
RT attempted x4 to place radial arterial line. Able to get flash but unable to thread even with doppler. Dr. Lindie Spruce made aware.

## 2017-09-30 NOTE — Progress Notes (Signed)
Pt extubated to Room air at this time per withdraw of life sustaining measures order. Family at bedside.

## 2017-09-30 NOTE — Progress Notes (Signed)
Visited with family as they were approaching the EOL for patient.  A priest had already performed rites for the patient.  The family gathered bedside as I helped comfort them in these tender moments.  Family requested only family in the room after extubation

## 2017-09-30 NOTE — Progress Notes (Signed)
Patient extubated at 1733. Family at the bedside along with the chaplain. Will call cardiac time of death when appropriate. Emotional support given. Ashland Osmer, [place] Scrape, RN
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
## 2017-09-30 NOTE — Progress Notes (Signed)
Pt spo2 84% Dr. Janee Morn stated to increase PEEP to 12, SPo2 dropped to 79% so changed back to 5 and rose back to 84%. Husband aware that patient is on max FIo2 and not responding to PEEP. Per husband they are trying to wait on father of patient to see her before cutting machine off.
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
## 2017-09-30 NOTE — Progress Notes (Signed)
Patient continues to be flaccid, no overbreathing, no corneal, no gag, no cough, pupils are midrange and nonreactive, no Doll's eye response, All consistent with the brain flow study which shows no flow to the brain.  She is brain dead based on NM flow study.  Declared brain dead at 1110 today based on additional evidence.  Marta Lamas. Gae Bon, MD, FACS [phone] Trauma Surgeon

## 2017-09-30 NOTE — Progress Notes (Signed)
Patient's cardiac time of death 58. CDS notified as well at the medical examiner. Family has left at this time. Spouse took patient's rings with him. Jeslyn Amsler, [place] Scrape, RN

## 2017-09-30 DEATH — deceased
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
